# Patient Record
Sex: Female | Born: 2000 | Race: White | Hispanic: No | Marital: Single | Smoking: Never smoker
Health system: Southern US, Community
[De-identification: ages and names within clinical notes are randomized; demographics above are authoritative.]

## PROBLEM LIST (undated history)

## (undated) DIAGNOSIS — I498 Other specified cardiac arrhythmias: Secondary | ICD-10-CM

---
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
## 2023-06-07 ENCOUNTER — Encounter: Payer: Self-pay | Admitting: Family Medicine

## 2023-06-07 ENCOUNTER — Encounter: Payer: Self-pay | Admitting: Pediatrics

## 2023-07-10 ENCOUNTER — Ambulatory Visit: Payer: BC Managed Care – PPO | Admitting: Family Medicine

## 2023-07-10 ENCOUNTER — Ambulatory Visit
Admission: EM | Admit: 2023-07-10 | Discharge: 2023-07-10 | Disposition: A | Discharge status: Home / Self Care | Payer: BC Managed Care – PPO | Attending: Nurse Practitioner | Admitting: Nurse Practitioner

## 2023-07-10 DIAGNOSIS — J029 Acute pharyngitis, unspecified: Secondary | ICD-10-CM | POA: Insufficient documentation

## 2023-07-10 DIAGNOSIS — B349 Viral infection, unspecified: Secondary | ICD-10-CM | POA: Insufficient documentation

## 2023-07-10 LAB — POC COVID19/FLU A&B COMBO
Covid Antigen, POC: NEGATIVE
Influenza A Antigen, POC: NEGATIVE
Influenza B Antigen, POC: NEGATIVE

## 2023-07-10 LAB — POCT RAPID STREP A (OFFICE): Rapid Strep A Screen: NEGATIVE

## 2023-07-10 MED ORDER — NYSTATIN 100000 UNIT/ML MT SUSP: 5.0000 mL | Freq: Four times a day (QID) | OROMUCOSAL | 0 refills | Status: DC | PRN

## 2023-07-10 MED ORDER — FLUTICASONE PROPIONATE 50 MCG/ACT NA SUSP: 2.0000 | Freq: Every day | NASAL | 0 refills | Status: DC

## 2023-07-10 NOTE — Discharge Instructions (Signed)
The rapid strep test and COVID/flu test were negative.  A throat culture is pending.  You will be contacted if the pending test results are abnormal. Take medication as prescribed. Increase fluids and allow for plenty of rest. Continue over-the-counter ibuprofen or Tylenol as needed for pain, fever, or general discomfort. Warm salt water gargles 3-4 times daily as needed for throat pain or discomfort. Recommend normal saline nasal spray throughout the day to help with nasal congestion and runny nose. For your throat pain, may be helpful to have a soft diet to include soup, broth, yogurt, pudding, or Jell-O while symptoms persist. May also use Chloraseptic throat spray or throat lozenges as needed. If your symptoms have not improved over the next 5 to 7 days, or appear to be worsening before that time, you may follow-up in this clinic or with your primary care physician for further evaluation. Follow-up as needed.

## 2023-07-10 NOTE — ED Provider Notes (Signed)
[facility]    CSN: [id number] Arrival date & time: 07/10/23  1541      History   Chief Complaint No chief complaint on file.   HPI Norma Wheeler is a 22 y.o. female.   The history is provided by the patient.   Patient presents for complaints of subjective fever, headache, sore throat, and nasal congestion.  Patient reports symptoms have been present for the past week.  States that she developed fever today.  Patient denies chills, body aches, ear pain, cough, chest pain, abdominal pain, nausea, vomiting, diarrhea, or rash.  Patient reports that she has taken ibuprofen today along with cough and cold medication.  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  Past Medical History:  Diagnosis Date   Atrial arrhythmia     There are no problems to display for this patient.   History reviewed. No pertinent surgical history.  OB History   No obstetric history on file.      Home Medications    Prior to Admission medications   Medication Sig Start Date End Date Taking? Authorizing Provider  fluticasone (FLONASE) 50 MCG/ACT nasal spray Place 2 sprays into both nostrils daily. 07/10/23  Yes Leath-Warren, Sadie Haber, NP  magic mouthwash (nystatin, hydrocortisone, diphenhydrAMINE, lidocaine) suspension Swish and swallow 5 mLs 4 (four) times daily as needed for mouth pain. 07/10/23  Yes Leath-Warren, Sadie Haber, NP  acetaminophen (TYLENOL) 325 MG tablet Take 325-650 mg by mouth every 6 (six) hours as needed for fever (pain).    [provider]  atenolol (TENORMIN) 25 MG tablet Take 25 mg by mouth 2 (two) times daily. 08/26/17   [provider]  guaiFENesin (MUCINEX) 600 MG 12 hr tablet Take 600 mg by mouth 2 (two) times daily as needed for cough (congestion).    [provider]  ibuprofen (ADVIL,MOTRIN) 200 MG tablet Take 200-400 mg by mouth every 6 (six) hours as needed for headache or cramping (pain).     [provider]    Family History History reviewed. No pertinent family history.  Social History Social History   Tobacco Use   Smoking status: Never   Smokeless tobacco: Never  Substance Use Topics   Alcohol use: No   Drug use: No     Allergies   Patient has no known allergies.   Review of Systems Review of Systems Per HPI  Physical Exam Triage Vital Signs ED Triage Vitals  Encounter Vitals Group     BP 07/10/23 1640 113/68     Systolic BP Percentile --      Diastolic BP Percentile --      Pulse Rate 07/10/23 1640 (!) 112     Resp 07/10/23 1640 17     Temp 07/10/23 1640 99.8 F (37.7 C)     Temp Source 07/10/23 1640 Oral     SpO2 07/10/23 1640 97 %     Weight --      Height --      Head Circumference --      Peak Flow --      Pain Score 07/10/23 1647 0     Pain Loc --      Pain Education --      Exclude from Growth Chart --    No data found.  Updated Vital Signs BP 113/68 (BP Location: Right Arm)   Pulse (!) 112   Temp 99.8 F (37.7 C) (Oral)   Resp  17   LMP 06/04/2023 (Within Weeks)   SpO2 97%   Visual Acuity Right Eye Distance:   Left Eye Distance:   Bilateral Distance:    Right Eye Near:   Left Eye Near:    Bilateral Near:     Physical Exam Vitals and nursing note reviewed.  Constitutional:      General: She is not in acute distress.    Appearance: Normal appearance.  HENT:     Head: Normocephalic.     Right Ear: Tympanic membrane, ear canal and external ear normal.     Left Ear: Tympanic membrane, ear canal and external ear normal.     Nose: Congestion present.     Mouth/Throat:     Mouth: Mucous membranes are moist.     Pharynx: Posterior oropharyngeal erythema present.  Eyes:     Extraocular Movements: Extraocular movements intact.     Conjunctiva/sclera: Conjunctivae normal.     Pupils: Pupils are equal, round, and reactive to light.  Cardiovascular:     Rate and Rhythm: Normal rate and regular rhythm.     Pulses:  Normal pulses.     Heart sounds: Normal heart sounds.  Pulmonary:     Effort: Pulmonary effort is normal. No respiratory distress.     Breath sounds: Normal breath sounds. No stridor. No wheezing, rhonchi or rales.  Abdominal:     General: Bowel sounds are normal.     Palpations: Abdomen is soft.     Tenderness: There is no abdominal tenderness.  Musculoskeletal:     Cervical back: Normal range of motion.  Lymphadenopathy:     Cervical: No cervical adenopathy.  Skin:    General: Skin is warm and dry.  Neurological:     General: No focal deficit present.     Mental Status: She is alert and oriented to person, place, and time.  Psychiatric:        Mood and Affect: Mood normal.        Behavior: Behavior normal.      UC Treatments / Results  Labs (all labs ordered are listed, but only abnormal results are displayed) Labs Reviewed  POCT RAPID STREP A (OFFICE)  POC COVID19/FLU A&B COMBO    EKG   Radiology No results found.  Procedures Procedures (including critical care time)  Medications Ordered in UC Medications - No data to display  Initial Impression / Assessment and Plan / UC Course  I have reviewed the triage vital signs and the nursing notes.  Pertinent labs & imaging results that were available during my care of the patient were reviewed by me and considered in my medical decision making (see chart for details).  The rapid strep test and COVID/flu test are negative.  A throat culture is pending.  Symptoms appear to be of viral etiology.  Will provide symptomatic treatment with fluticasone 50 micro nasal spray for nasal congestion, and Magic mouthwash for patient to gargle, swish and swallow for throat pain or discomfort.  Supportive care recommendations were provided and discussed with the patient to include continuing over-the-counter analgesics, normal saline nasal spray, warm salt water gargles, and a soft diet until symptoms improve.  Patient was given strict  indications of when follow-up be necessary.  Patient was in agreement with this plan of care and verbalized understanding.  All questions were answered.  Patient stable for discharge.  Final Clinical Impressions(s) / UC Diagnoses   Final diagnoses:  Viral illness  Sore throat  Discharge Instructions      The rapid strep test and COVID/flu test were negative.  A throat culture is pending.  You will be contacted if the pending test results are abnormal. Take medication as prescribed. Increase fluids and allow for plenty of rest. Continue over-the-counter ibuprofen or Tylenol as needed for pain, fever, or general discomfort. Warm salt water gargles 3-4 times daily as needed for throat pain or discomfort. Recommend normal saline nasal spray throughout the day to help with nasal congestion and runny nose. For your throat pain, may be helpful to have a soft diet to include soup, broth, yogurt, pudding, or Jell-O while symptoms persist. May also use Chloraseptic throat spray or throat lozenges as needed. If your symptoms have not improved over the next 5 to 7 days, or appear to be worsening before that time, you may follow-up in this clinic or with your primary care physician for further evaluation. Follow-up as needed.     ED Prescriptions     Medication Sig Dispense Auth. Provider   fluticasone (FLONASE) 50 MCG/ACT nasal spray Place 2 sprays into both nostrils daily. 16 g Leath-Warren, Sadie Haber, NP   magic mouthwash (nystatin, hydrocortisone, diphenhydrAMINE, lidocaine) suspension Swish and swallow 5 mLs 4 (four) times daily as needed for mouth pain. 300 mL Leath-Warren, Sadie Haber, NP      PDMP not reviewed this encounter.   Abran Cantor, NP 07/10/23 1711

## 2023-07-10 NOTE — ED Triage Notes (Signed)
Pt reports flu like sx's, x 1 week headache nasal congestion sore throat, today a fever started.

## 2023-07-13 LAB — CULTURE, GROUP A STREP (THRC)

## 2023-07-24 ENCOUNTER — Ambulatory Visit: Payer: BC Managed Care – PPO | Admitting: Family Medicine

## 2023-08-19 ENCOUNTER — Encounter: Payer: Self-pay | Admitting: Family Medicine

## 2023-08-19 ENCOUNTER — Ambulatory Visit: Payer: 59 | Admitting: Family Medicine

## 2023-08-19 VITALS — BP 116/72 | HR 105 | Temp 97.7°F | Ht 62.0 in | Wt 113.0 lb

## 2023-08-19 DIAGNOSIS — Z0001 Encounter for general adult medical examination with abnormal findings: Secondary | ICD-10-CM

## 2023-08-19 DIAGNOSIS — R000 Tachycardia, unspecified: Secondary | ICD-10-CM | POA: Insufficient documentation

## 2023-08-19 DIAGNOSIS — Z1159 Encounter for screening for other viral diseases: Secondary | ICD-10-CM

## 2023-08-19 DIAGNOSIS — Z0000 Encounter for general adult medical examination without abnormal findings: Secondary | ICD-10-CM | POA: Insufficient documentation

## 2023-08-19 DIAGNOSIS — Z114 Encounter for screening for human immunodeficiency virus [HIV]: Secondary | ICD-10-CM | POA: Diagnosis not present

## 2023-08-19 DIAGNOSIS — Z13220 Encounter for screening for lipoid disorders: Secondary | ICD-10-CM

## 2023-08-19 NOTE — Progress Notes (Signed)
 New Patient Office Visit  Subjective    Patient ID: Norma Wheeler, female    DOB: August 03, 2001  Age: 23 y.o. MRN: [id number]  CC:  Chief Complaint  Patient presents with   Establish Care    HPI Natalina Wieting presents to establish care. Oriented to practice routines and expectations. Has been seeing PCP regularly and Cardiology annual for her history of atrial arrhythmias. These are self limited episodes that occur rarely, her last episode was April of 2024 and she reports she begins to feel weak and a rapid heart rate and she does a handstand and this stops her symptoms. No other significant PMH.  Tobacco: non-smoker STI: declines Vaccines: UTD, declines flu    Outpatient Encounter Medications as of 08/19/2023  Medication Sig   acetaminophen (TYLENOL) 325 MG tablet Take 325-650 mg by mouth every 6 (six) hours as needed for fever (pain). (Patient not taking: Reported on 08/19/2023)   atenolol (TENORMIN) 25 MG tablet Take 25 mg by mouth 2 (two) times daily. (Patient not taking: Reported on 08/19/2023)   fluticasone  (FLONASE ) 50 MCG/ACT nasal spray Place 2 sprays into both nostrils daily. (Patient not taking: Reported on 08/19/2023)   guaiFENesin (MUCINEX) 600 MG 12 hr tablet Take 600 mg by mouth 2 (two) times daily as needed for cough (congestion). (Patient not taking: Reported on 08/19/2023)   ibuprofen (ADVIL,MOTRIN) 200 MG tablet Take 200-400 mg by mouth every 6 (six) hours as needed for headache or cramping (pain). (Patient not taking: Reported on 08/19/2023)   magic mouthwash (nystatin , hydrocortisone, diphenhydrAMINE, lidocaine) suspension Swish and swallow 5 mLs 4 (four) times daily as needed for mouth pain. (Patient not taking: Reported on 08/19/2023)   No facility-administered encounter medications on file as of 08/19/2023.    Past Medical History:  Diagnosis Date   Atrial arrhythmia     History reviewed. No pertinent surgical history.  History reviewed. No pertinent family  history.  Social History   Socioeconomic History   Marital status: Single    Spouse name: Not on file   Number of children: Not on file   Years of education: Not on file   Highest education level: Not on file  Occupational History   Not on file  Tobacco Use   Smoking status: Never   Smokeless tobacco: Never  Substance and Sexual Activity   Alcohol use: No   Drug use: No   Sexual activity: Not Currently  Other Topics Concern   Not on file  Social History Narrative   Not on file   Social Drivers of Health   Financial Resource Strain: Not on file  Food Insecurity: Not on file  Transportation Needs: Not on file  Physical Activity: Not on file  Stress: Not on file  Social Connections: Not on file  Intimate Partner Violence: Not on file    Review of Systems  Constitutional: Negative.   HENT: Negative.    Eyes: Negative.   Respiratory: Negative.    Cardiovascular: Negative.   Gastrointestinal: Negative.   Genitourinary: Negative.   Musculoskeletal: Negative.   Skin: Negative.   Neurological: Negative.   Endo/Heme/Allergies: Negative.   Psychiatric/Behavioral: Negative.    All other systems reviewed and are negative.       Objective    BP 116/72   Pulse (!) 105   Temp 97.7 F (36.5 C) (Oral)   Ht 5' 2 (1.575 m)   Wt 113 lb (51.3 kg)   LMP 08/16/2023 (Exact Date)   SpO2 99%  BMI 20.67 kg/m   Physical Exam Vitals and nursing note reviewed.  Constitutional:      Appearance: Normal appearance. She is normal weight.  HENT:     Head: Normocephalic and atraumatic.     Right Ear: Tympanic membrane, ear canal and external ear normal.     Left Ear: Tympanic membrane, ear canal and external ear normal.     Nose: Nose normal.     Mouth/Throat:     Mouth: Mucous membranes are moist.     Pharynx: Oropharynx is clear.  Eyes:     Extraocular Movements: Extraocular movements intact.     Conjunctiva/sclera: Conjunctivae normal.     Pupils: Pupils are equal,  round, and reactive to light.  Cardiovascular:     Rate and Rhythm: Regular rhythm. Tachycardia present.     Pulses: Normal pulses.     Heart sounds: Normal heart sounds.  Pulmonary:     Effort: Pulmonary effort is normal.     Breath sounds: Normal breath sounds.  Abdominal:     General: Bowel sounds are normal.     Palpations: Abdomen is soft.  Musculoskeletal:        General: Normal range of motion.     Cervical back: Normal range of motion and neck supple.  Skin:    General: Skin is warm and dry.     Capillary Refill: Capillary refill takes less than 2 seconds.  Neurological:     General: No focal deficit present.     Mental Status: She is alert and oriented to person, place, and time. Mental status is at baseline.  Psychiatric:        Mood and Affect: Mood normal.        Behavior: Behavior normal.        Thought Content: Thought content normal.        Judgment: Judgment normal.         Assessment & Plan:   Problem List Items Addressed This Visit     Physical exam, annual - Primary   Today your medical history was reviewed and routine physical exam with labs was performed. Recommend 150 minutes of moderate intensity exercise weekly and consuming a well-balanced diet. Advised to stop smoking if a smoker, avoid smoking if a non-smoker, limit alcohol consumption to 1 drink per day for women and 2 drinks per day for men, and avoid illicit drug use. Counseled on safe sex practices and offered STI testing today. Counseled on the importance of sunscreen use. Counseled in mental health awareness and when to seek medical care. Vaccine maintenance discussed. Appropriate health maintenance items reviewed. Return to office in 1 year for annual physical exam.       Relevant Orders   CBC with Differential/Platelet   COMPLETE METABOLIC PANEL WITH GFR   Lipid panel   Hepatitis C antibody   HIV Antibody (routine testing w rflx)   Tachycardia   Chronic with history of atrial  tachycardia followed by cardiology. Unable to obtain baseline EKG due to no machine available in office, she will return later this week. Asymptomatic. Reinforced symptoms for which to seek medical care. Medical records have been requested.       Relevant Orders   EKG 12-Lead   Other Visit Diagnoses       Screening for HIV (human immunodeficiency virus)       Relevant Orders   HIV Antibody (routine testing w rflx)     Need for hepatitis C screening test  Relevant Orders   Hepatitis C antibody     Screening for lipoid disorders       Relevant Orders   Lipid panel       Return in about 1 year (around 08/18/2024) for annual physical with labs 1 week prior.   Jeoffrey GORMAN Barrio, FNP

## 2023-08-19 NOTE — Patient Instructions (Signed)
 It was great to meet you today and I'm excited to have you join the [facility] practice. I hope you had a positive experience today! If you feel so inclined, please feel free to recommend our practice to friends and family. Jeoffrey Barrio, FNP-C

## 2023-08-19 NOTE — Assessment & Plan Note (Signed)
 Chronic with history of atrial tachycardia followed by cardiology. Unable to obtain baseline EKG due to no machine available in office, she will return later this week. Asymptomatic. Reinforced symptoms for which to seek medical care. Medical records have been requested.

## 2023-08-19 NOTE — Assessment & Plan Note (Signed)

## 2023-08-20 LAB — LIPID PANEL
Cholesterol: 174 mg/dL (ref <200)
HDL: 59 mg/dL (ref >=50)
LDL Cholesterol (Calc): 100 mg/dL — ABNORMAL HIGH
Non-HDL Cholesterol (Calc): 115 mg/dL (ref <130)
Total CHOL/HDL Ratio: 2.9 (calc) (ref <5.0)
Triglycerides: 68 mg/dL (ref <150)

## 2023-08-20 LAB — COMPLETE METABOLIC PANEL WITH GFR
AG Ratio: 2 (calc) (ref 1.0–2.5)
ALT: 13 U/L (ref 6–29)
AST: 17 U/L (ref 10–30)
Albumin: 4.5 g/dL (ref 3.6–5.1)
Alkaline phosphatase (APISO): 46 U/L (ref 31–125)
BUN: 15 mg/dL (ref 7–25)
CO2: 26 mmol/L (ref 20–32)
Calcium: 9.5 mg/dL (ref 8.6–10.2)
Chloride: 106 mmol/L (ref 98–110)
Creat: 0.74 mg/dL (ref 0.50–0.96)
Globulin: 2.2 g/dL (ref 1.9–3.7)
Glucose, Bld: 82 mg/dL (ref 65–99)
Potassium: 4.1 mmol/L (ref 3.5–5.3)
Sodium: 142 mmol/L (ref 135–146)
Total Bilirubin: 0.5 mg/dL (ref 0.2–1.2)
Total Protein: 6.7 g/dL (ref 6.1–8.1)
eGFR: 119 mL/min/{1.73_m2} (ref >=60)

## 2023-08-20 LAB — CBC WITH DIFFERENTIAL/PLATELET
Absolute Lymphocytes: 1382 {cells}/uL (ref 850–3900)
Absolute Monocytes: 449 {cells}/uL (ref 200–950)
Basophils Absolute: 40 {cells}/uL (ref 0–200)
Basophils Relative: 0.9 %
Eosinophils Absolute: 79 {cells}/uL (ref 15–500)
Eosinophils Relative: 1.8 %
HCT: 40.1 % (ref 35.0–45.0)
Hemoglobin: 13.5 g/dL (ref 11.7–15.5)
MCH: 31 pg (ref 27.0–33.0)
MCHC: 33.7 g/dL (ref 32.0–36.0)
MCV: 92.2 fL (ref 80.0–100.0)
MPV: 10.4 fL (ref 7.5–12.5)
Monocytes Relative: 10.2 %
Neutro Abs: 2451 {cells}/uL (ref 1500–7800)
Neutrophils Relative %: 55.7 %
Platelets: 274 10*3/uL (ref 140–400)
RBC: 4.35 10*6/uL (ref 3.80–5.10)
RDW: 12 % (ref 11.0–15.0)
Total Lymphocyte: 31.4 %
WBC: 4.4 10*3/uL (ref 3.8–10.8)

## 2023-08-20 LAB — HEPATITIS C ANTIBODY: Hepatitis C Ab: NONREACTIVE

## 2023-08-20 LAB — HIV ANTIBODY (ROUTINE TESTING W REFLEX): HIV 1&2 Ab, 4th Generation: NONREACTIVE

## 2023-11-27 ENCOUNTER — Telehealth: Payer: Self-pay

## 2023-11-27 ENCOUNTER — Other Ambulatory Visit: Payer: Self-pay

## 2023-11-27 DIAGNOSIS — I471 Supraventricular tachycardia, unspecified: Secondary | ICD-10-CM

## 2023-11-27 NOTE — Telephone Encounter (Signed)
 Copied from CRM [id number]. Topic: Referral - Request for Referral >> Nov 27, 2023  3:02 PM Ivette P wrote: Did the patient discuss referral with their provider in the last year? Yes (If No - schedule appointment) (If Yes - send message)  Appointment offered? No  Type of order/referral and detailed reason for visit:  Electrophysiologist for an ablation  Preference of office, provider, location:   Agatha Horsfall, MD [address],   [place], [place] [address]  Phone: [phone] Fax:[phone]  If referral order, have you been seen by this specialty before? No (If Yes, this issue or another issue? When? Where?  Can we respond through MyChart? Yes

## 2023-12-31 ENCOUNTER — Ambulatory Visit: Admitting: Cardiology

## 2024-01-20 ENCOUNTER — Ambulatory Visit: Attending: Internal Medicine | Admitting: Internal Medicine

## 2024-01-20 ENCOUNTER — Encounter: Payer: Self-pay | Admitting: Internal Medicine

## 2024-01-20 ENCOUNTER — Telehealth: Payer: Self-pay | Admitting: Internal Medicine

## 2024-01-20 VITALS — BP 92/60 | HR 92 | Ht 62.0 in | Wt 114.6 lb

## 2024-01-20 DIAGNOSIS — I471 Supraventricular tachycardia, unspecified: Secondary | ICD-10-CM

## 2024-01-20 NOTE — Progress Notes (Signed)
      HPI Norma Wheeler is referred today for evaluation of SVT. 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 She has taken atenolol in the past but not currently. She graduated college and majored in dance and plans to travel to [place]  city for more intensive training in August. She notes that her episodes start and stop suddenly and she can make them stop by doing a hand stand. She has not had syncope. She feels palpitations in SVT. Her rates are around 200/min. She has recently had an increase in the frequency.  No Known Allergies   No current outpatient medications on file.   No current facility-administered medications for this visit.     Past Medical History:  Diagnosis Date   Atrial arrhythmia     ROS:   All systems reviewed and negative except as noted in the HPI.   History reviewed. No pertinent surgical history.   History reviewed. No pertinent family history.   Social History   Socioeconomic History   Marital status: Single    Spouse name: Not on file   Number of children: Not on file   Years of education: Not on file   Highest education level: Not on file  Occupational History   Not on file  Tobacco Use   Smoking status: Never   Smokeless tobacco: Never  Substance and Sexual Activity   Alcohol use: No   Drug use: No   Sexual activity: Not Currently  Other Topics Concern   Not on file  Social History Narrative   Not on file   Social Drivers of Health   Financial Resource Strain: Not on file  Food Insecurity: Not on file  Transportation Needs: Not on file  Physical Activity: Not on file  Stress: Not on file  Social Connections: Not on file  Intimate Partner Violence: Not on file     BP 92/60   Pulse 92   Ht 5\' 2"  (1.575 m)   Wt 114 lb 9.6 oz (52 kg)   SpO2 97%   BMI 20.96 kg/m   Physical Exam:  Well appearing NAD HEENT: Unremarkable Neck:  No JVD, no thyromegally Lymphatics:  No  adenopathy Back:  No CVA tenderness Lungs:  Clear with no wheezes HEART:  Regular rate rhythm, no murmurs, no rubs, no clicks Abd:  soft, positive bowel sounds, no organomegally, no rebound, no guarding Ext:  2 plus pulses, no edema, no cyanosis, no clubbing Skin:  No rashes no nodules Neuro:  CN II through XII intact, motor grossly intact  EKG - nsr with no pre-excitation  DEVICE  Normal device function.  See PaceArt for details.   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 I have discussed the indications/risks/benefits/goals/expectations of catheter ablation and she will call us  if she wishes to proceed.  Pete Brand Brallan Denio,MD

## 2024-01-20 NOTE — Telephone Encounter (Signed)
 Pt's mother would like a c/b regarding the type of anesthesia that is used for an Ablation. Please advise

## 2024-01-20 NOTE — Patient Instructions (Addendum)
 Medication Instructions:  Your physician recommends that you continue on your current medications as directed. Please refer to the Current Medication list given to you today.  *If you need a refill on your cardiac medications before your next appointment, please call your pharmacy*  Lab Work: Labs within 30 days of procedure  You may go to any [facility] Location for your lab work:  [facility] - [address] ([facility]) - [address] [phone] [address]  [place] - [address]   [place]  - [address]   [place] - [address] - [address] [facility]  - [address] - [address] ([facility]   If you have labs (blood work) drawn today and your tests are completely normal, you will receive your results only by: [employer] (if you have MyChart)  If you have any lab test that is abnormal or we need to change your treatment, we will call you or send a MyChart message to review the results.  Testing/Procedures: Call to schedule  Follow-Up: At [facility], you and your health needs are our priority.  As part of our continuing mission to provide you with exceptional heart care, we have created designated Provider Care Teams.  These Care Teams include your primary Cardiologist (physician) and Advanced Practice Providers (APPs -  Physician Assistants and Nurse Practitioners) who all work together to provide you with the care you need, when you need it.  Your next appointment:   To be scheduled  The format for your next appointment:   In Person  Provider:   Manya Sells, MD{or one of the following Advanced Practice Providers on your designated Care Team:   Mertha Abrahams, [place] Bambi Lever "Jonelle Neri" [place], [place] Neda Balk, NP  Note: Remote monitoring is used to monitor your Pacemaker/ ICD from home. This monitoring reduces the number of office  visits required to check your device to one time per year. It allows us  to keep an eye on the functioning of your device to ensure it is working properly.

## 2024-01-22 ENCOUNTER — Encounter: Payer: Self-pay | Admitting: Internal Medicine

## 2024-01-22 NOTE — Telephone Encounter (Signed)
 Patients mother, Avanell Bob called in to me directly stating that after their consultation with Dr. Carolynne Citron on 6/9, they still had a few questions while they decide if to proceed with the ablation or to not.  -If Dr. Carolynne Citron will not use general anesthesia, what is used? -Will Jannelle be aware? Is she gonna be awake? -If they decide to not do the ablation, and as she gets older, could her  sx gets worse? Since 7/2 is held, when does a decision need to be made by?

## 2024-01-22 NOTE — Telephone Encounter (Signed)
 error

## 2024-01-23 NOTE — Telephone Encounter (Signed)
 Spoke with Norma Wheeler (per [facility]) and went over any questions she had. They will call back at the first of the week with an answer.

## 2024-01-28 NOTE — Telephone Encounter (Signed)
 Pt's Mother called and said they have decided to hold off on the procedure.
